# Patient Record
Sex: Male | Born: 1995 | Hispanic: Yes | Marital: Single | Smoking: Never smoker
Health system: Southern US, Community
[De-identification: ages and names within clinical notes are randomized; demographics above are authoritative.]

---

## 2017-08-28 ENCOUNTER — Encounter: Payer: Self-pay | Admitting: Emergency Medicine

## 2017-08-28 ENCOUNTER — Other Ambulatory Visit: Payer: Self-pay

## 2017-08-28 ENCOUNTER — Emergency Department: Payer: No Typology Code available for payment source

## 2017-08-28 ENCOUNTER — Emergency Department
Admission: EM | Admit: 2017-08-28 | Discharge: 2017-08-28 | Disposition: A | Discharge status: Home / Self Care | Payer: No Typology Code available for payment source | Attending: Emergency Medicine | Admitting: Emergency Medicine

## 2017-08-28 DIAGNOSIS — Y999 Unspecified external cause status: Secondary | ICD-10-CM | POA: Insufficient documentation

## 2017-08-28 DIAGNOSIS — S0990XA Unspecified injury of head, initial encounter: Secondary | ICD-10-CM | POA: Diagnosis present

## 2017-08-28 DIAGNOSIS — S0083XA Contusion of other part of head, initial encounter: Secondary | ICD-10-CM | POA: Diagnosis not present

## 2017-08-28 DIAGNOSIS — T148XXA Other injury of unspecified body region, initial encounter: Secondary | ICD-10-CM

## 2017-08-28 DIAGNOSIS — S80811A Abrasion, right lower leg, initial encounter: Secondary | ICD-10-CM

## 2017-08-28 DIAGNOSIS — Y9389 Activity, other specified: Secondary | ICD-10-CM | POA: Insufficient documentation

## 2017-08-28 DIAGNOSIS — Y92410 Unspecified street and highway as the place of occurrence of the external cause: Secondary | ICD-10-CM | POA: Diagnosis not present

## 2017-08-28 MED ORDER — CYCLOBENZAPRINE HCL 10 MG PO TABS: 10.0000 mg | ORAL_TABLET | Freq: Three times a day (TID) | ORAL | 0 refills | Status: AC | PRN

## 2017-08-28 MED ORDER — NAPROXEN 500 MG PO TABS: 500.0000 mg | ORAL_TABLET | Freq: Two times a day (BID) | ORAL | 0 refills | Status: AC
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
## 2017-08-28 NOTE — Discharge Instructions (Signed)
Follow up with the primary care provider of your choice for symptoms that are not improving over the next 2 days.  Return to the ER for symptoms that change or worsen if unable to schedule an appointment.
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
## 2019-07-26 ENCOUNTER — Other Ambulatory Visit: Payer: Self-pay

## 2019-07-26 DIAGNOSIS — Z20822 Contact with and (suspected) exposure to covid-19: Secondary | ICD-10-CM

## 2019-07-28 LAB — NOVEL CORONAVIRUS, NAA: SARS-CoV-2, NAA: NOT DETECTED
